# Patient Record
Sex: Male | Born: 1970 | Smoking: Former smoker
Health system: Southern US, Community
[De-identification: ages and names within clinical notes are randomized; demographics above are authoritative.]

## PROBLEM LIST (undated history)

## (undated) DIAGNOSIS — R011 Cardiac murmur, unspecified: Secondary | ICD-10-CM

## (undated) DIAGNOSIS — T4145XA Adverse effect of unspecified anesthetic, initial encounter: Secondary | ICD-10-CM

## (undated) DIAGNOSIS — T8859XA Other complications of anesthesia, initial encounter: Secondary | ICD-10-CM

## (undated) DIAGNOSIS — Z8489 Family history of other specified conditions: Secondary | ICD-10-CM

## (undated) DIAGNOSIS — I499 Cardiac arrhythmia, unspecified: Secondary | ICD-10-CM

## (undated) DIAGNOSIS — M1990 Unspecified osteoarthritis, unspecified site: Secondary | ICD-10-CM

---

## 2017-09-25 ENCOUNTER — Ambulatory Visit: Payer: Self-pay | Admitting: Orthopedic Surgery

## 2017-10-02 ENCOUNTER — Ambulatory Visit: Payer: Self-pay | Admitting: Orthopedic Surgery

## 2017-10-02 NOTE — H&P (View-Only) (Signed)
TOTAL HIP ADMISSION H&P  Patient is admitted for left total hip arthroplasty.  Subjective:  Chief Complaint: left hip pain  HPI: Michael Mcintosh, 47 y.o. male, has a history of pain and functional disability in the left hip(s) due to arthritis and patient has failed non-surgical conservative treatments for greater than 12 weeks to include NSAID's and/or analgesics, flexibility and strengthening excercises, use of assistive devices, weight reduction as appropriate and activity modification.  Onset of symptoms was gradual starting 2 years ago with rapidlly worsening course since that time.The patient noted no past surgery on the left hip(s).  Patient currently rates pain in the left hip at 10 out of 10 with activity. Patient has night pain, worsening of pain with activity and weight bearing, trendelenberg gait, pain that interfers with activities of daily living and pain with passive range of motion. Patient has evidence of subchondral cysts, subchondral sclerosis, periarticular osteophytes and joint space narrowing by imaging studies. This condition presents safety issues increasing the risk of falls.   There is no current active infection.  There are no active problems to display for this patient.  Past Medical History:  Diagnosis Date  . Complication of anesthesia    never had anesthesia  . Dysrhythmia    slow HR arrythmia about 5 years ago, resolved  . Family history of adverse reaction to anesthesia    father stopped breathing with hip surgery  . Heart murmur    "when I was younger"    No past surgical history on file.  Current Outpatient Medications  Medication Sig Dispense Refill Last Dose  . lisinopril (PRINIVIL,ZESTRIL) 20 MG tablet Take 20 mg by mouth daily.      No current facility-administered medications for this visit.    Facility-Administered Medications Ordered in Other Visits  Medication Dose Route Frequency Provider Last Rate Last Dose  . [START ON 10/21/2017]  tranexamic acid (CYKLOKAPRON) 1,000 mg in sodium chloride 0.9 % 100 mL IVPB  1,000 mg Intravenous To OR Kameo Bains, Arlys JohnBrian, MD       No Known Allergies  Social History   Tobacco Use  . Smoking status: Former Smoker    Types: Cigarettes  . Smokeless tobacco: Current User    Types: Chew  . Tobacco comment: quit in 2013  Substance Use Topics  . Alcohol use: Yes    Comment: occasionally- few drinks weekly    No family history on file.   Review of Systems  Constitutional: Negative.   HENT: Negative.   Eyes: Negative.   Respiratory: Negative.   Cardiovascular: Negative.   Gastrointestinal: Negative.   Genitourinary: Negative.   Musculoskeletal: Positive for back pain and joint pain.  Skin: Negative.   Neurological: Negative.   Endo/Heme/Allergies: Negative.   Psychiatric/Behavioral: Negative.     Objective:  Physical Exam  Vitals reviewed. Constitutional: He is oriented to person, place, and time. He appears well-developed and well-nourished.  HENT:  Head: Normocephalic and atraumatic.  Eyes: Pupils are equal, round, and reactive to light. Conjunctivae and EOM are normal.  Neck: Normal range of motion. Neck supple.  Cardiovascular: Normal rate, regular rhythm and intact distal pulses.  Respiratory: Effort normal. No respiratory distress.  GI: Soft. He exhibits no distension.  Genitourinary:  Genitourinary Comments: deferred  Neurological: He is alert and oriented to person, place, and time. He has normal reflexes.  Skin: Skin is warm and dry.  Psychiatric: He has a normal mood and affect. His behavior is normal. Judgment and thought content normal.  Vital signs in last 24 hours: @VSRANGES @  Labs:   Estimated body mass index is 36.46 kg/m as calculated from the following:   Height as of 10/07/17: 5' 9.5" (1.765 m).   Weight as of 10/07/17: 113.6 kg (250 lb 8 oz).   Imaging Review Plain radiographs demonstrate severe degenerative joint disease of the left hip(s).  The bone quality appears to be adequate for age and reported activity level.    Preoperative templating of the joint replacement has been completed, documented, and submitted to the Operating Room personnel in order to optimize intra-operative equipment management.     Assessment/Plan:  End stage arthritis, left hip(s)  The patient history, physical examination, clinical judgement of the provider and imaging studies are consistent with end stage degenerative joint disease of the left hip(s) and total hip arthroplasty is deemed medically necessary. The treatment options including medical management, injection therapy, arthroscopy and arthroplasty were discussed at length. The risks and benefits of total hip arthroplasty were presented and reviewed. The risks due to aseptic loosening, infection, stiffness, dislocation/subluxation,  thromboembolic complications and other imponderables were discussed.  The patient acknowledged the explanation, agreed to proceed with the plan and consent was signed. Patient is being admitted for inpatient treatment for surgery, pain control, PT, OT, prophylactic antibiotics, VTE prophylaxis, progressive ambulation and ADL's and discharge planning.The patient is planning to be discharged home with HEP.

## 2017-10-02 NOTE — H&P (Signed)
TOTAL HIP ADMISSION H&P  Patient is admitted for left total hip arthroplasty.  Subjective:  Chief Complaint: left hip pain  HPI: Michael Mcintosh, 47 y.o. male, has a history of pain and functional disability in the left hip(s) due to arthritis and patient has failed non-surgical conservative treatments for greater than 12 weeks to include NSAID's and/or analgesics, flexibility and strengthening excercises, use of assistive devices, weight reduction as appropriate and activity modification.  Onset of symptoms was gradual starting 2 years ago with rapidlly worsening course since that time.The patient noted no past surgery on the left hip(s).  Patient currently rates pain in the left hip at 10 out of 10 with activity. Patient has night pain, worsening of pain with activity and weight bearing, trendelenberg gait, pain that interfers with activities of daily living and pain with passive range of motion. Patient has evidence of subchondral cysts, subchondral sclerosis, periarticular osteophytes and joint space narrowing by imaging studies. This condition presents safety issues increasing the risk of falls.   There is no current active infection.  There are no active problems to display for this patient.  Past Medical History:  Diagnosis Date  . Complication of anesthesia    never had anesthesia  . Dysrhythmia    slow HR arrythmia about 5 years ago, resolved  . Family history of adverse reaction to anesthesia    father stopped breathing with hip surgery  . Heart murmur    "when I was younger"    No past surgical history on file.  Current Outpatient Medications  Medication Sig Dispense Refill Last Dose  . lisinopril (PRINIVIL,ZESTRIL) 20 MG tablet Take 20 mg by mouth daily.      No current facility-administered medications for this visit.    Facility-Administered Medications Ordered in Other Visits  Medication Dose Route Frequency Provider Last Rate Last Dose  . [START ON 10/21/2017]  tranexamic acid (CYKLOKAPRON) 1,000 mg in sodium chloride 0.9 % 100 mL IVPB  1,000 mg Intravenous To OR Chloie Loney, MD       No Known Allergies  Social History   Tobacco Use  . Smoking status: Former Smoker    Types: Cigarettes  . Smokeless tobacco: Current User    Types: Chew  . Tobacco comment: quit in 2013  Substance Use Topics  . Alcohol use: Yes    Comment: occasionally- few drinks weekly    No family history on file.   Review of Systems  Constitutional: Negative.   HENT: Negative.   Eyes: Negative.   Respiratory: Negative.   Cardiovascular: Negative.   Gastrointestinal: Negative.   Genitourinary: Negative.   Musculoskeletal: Positive for back pain and joint pain.  Skin: Negative.   Neurological: Negative.   Endo/Heme/Allergies: Negative.   Psychiatric/Behavioral: Negative.     Objective:  Physical Exam  Vitals reviewed. Constitutional: He is oriented to person, place, and time. He appears well-developed and well-nourished.  HENT:  Head: Normocephalic and atraumatic.  Eyes: Pupils are equal, round, and reactive to light. Conjunctivae and EOM are normal.  Neck: Normal range of motion. Neck supple.  Cardiovascular: Normal rate, regular rhythm and intact distal pulses.  Respiratory: Effort normal. No respiratory distress.  GI: Soft. He exhibits no distension.  Genitourinary:  Genitourinary Comments: deferred  Neurological: He is alert and oriented to person, place, and time. He has normal reflexes.  Skin: Skin is warm and dry.  Psychiatric: He has a normal mood and affect. His behavior is normal. Judgment and thought content normal.      Vital signs in last 24 hours: @VSRANGES@  Labs:   Estimated body mass index is 36.46 kg/m as calculated from the following:   Height as of 10/07/17: 5' 9.5" (1.765 m).   Weight as of 10/07/17: 113.6 kg (250 lb 8 oz).   Imaging Review Plain radiographs demonstrate severe degenerative joint disease of the left hip(s).  The bone quality appears to be adequate for age and reported activity level.    Preoperative templating of the joint replacement has been completed, documented, and submitted to the Operating Room personnel in order to optimize intra-operative equipment management.     Assessment/Plan:  End stage arthritis, left hip(s)  The patient history, physical examination, clinical judgement of the provider and imaging studies are consistent with end stage degenerative joint disease of the left hip(s) and total hip arthroplasty is deemed medically necessary. The treatment options including medical management, injection therapy, arthroscopy and arthroplasty were discussed at length. The risks and benefits of total hip arthroplasty were presented and reviewed. The risks due to aseptic loosening, infection, stiffness, dislocation/subluxation,  thromboembolic complications and other imponderables were discussed.  The patient acknowledged the explanation, agreed to proceed with the plan and consent was signed. Patient is being admitted for inpatient treatment for surgery, pain control, PT, OT, prophylactic antibiotics, VTE prophylaxis, progressive ambulation and ADL's and discharge planning.The patient is planning to be discharged home with HEP. 

## 2017-10-04 NOTE — Pre-Procedure Instructions (Addendum)
Michael Mcintosh  10/04/2017     No Pharmacies Listed   Your procedure is scheduled on July 22  [facility] at 5:30 A.M.  Call this number if you have problems the morning of surgery:  [phone]   Remember:   Do not eat or drink after midnight.                        Take these medicines the morning of surgery with A SIP OF WATER : none              7 days prior to surgery STOP taking any Aspirin(unless otherwise instructed by your surgeon), Aleve, Naproxen, Ibuprofen, Motrin, Advil, Goody's, BC's, all herbal medications, fish oil, and all vitamins    Do not wear jewelry.  Do not wear lotions, powders, or perfumes, or deodorant.  Do not shave 48 hours prior to surgery.  Men may shave face and neck.  Do not bring valuables to the hospital.  [facility] is not responsible for any belongings or valuables.  Contacts, dentures or bridgework may not be worn into surgery.  Leave your suitcase in the car.  After surgery it may be brought to your room.  For patients admitted to the hospital, discharge time will be determined by your treatment team.  Patients discharged the day of surgery will not be allowed to drive home.    Special instructions:   [facility]- Preparing For Surgery  Before surgery, you can play an important role. Because skin is not sterile, your skin needs to be as free of germs as possible. You can reduce the number of germs on your skin by washing with CHG (chlorahexidine gluconate) Soap before surgery.  CHG is an antiseptic cleaner which kills germs and bonds with the skin to continue killing germs even after washing.    Oral Hygiene is also important to reduce your risk of infection.  Remember - BRUSH YOUR TEETH THE MORNING OF SURGERY WITH YOUR REGULAR TOOTHPASTE  Please do not use if you have an allergy to CHG or antibacterial soaps. If your skin becomes reddened/irritated stop using the CHG.  Do not shave (including legs  and underarms) for at least 48 hours prior to first CHG shower. It is OK to shave your face.  Please follow these instructions carefully.   1. Shower the NIGHT BEFORE SURGERY and the MORNING OF SURGERY with CHG.   2. If you chose to wash your hair, wash your hair first as usual with your normal shampoo.  3. After you shampoo, rinse your hair and body thoroughly to remove the shampoo.  4. Use CHG as you would any other liquid soap. You can apply CHG directly to the skin and wash gently with a scrungie or a clean washcloth.   5. Apply the CHG Soap to your body ONLY FROM THE NECK DOWN.  Do not use on open wounds or open sores. Avoid contact with your eyes, ears, mouth and genitals (private parts). Wash Face and genitals (private parts)  with your normal soap.  6. Wash thoroughly, paying special attention to the area where your surgery will be performed.  7. Thoroughly rinse your body with warm water from the neck down.  8. DO NOT shower/wash with your normal soap after using and rinsing off the CHG Soap.  9. Pat yourself dry with a CLEAN TOWEL.  10. Wear CLEAN PAJAMAS to bed the night before  surgery, wear comfortable clothes the morning of surgery  11. Place CLEAN SHEETS on your bed the night of your first shower and DO NOT SLEEP WITH PETS.    Day of Surgery:  Do not apply any deodorants/lotions.  Please wear clean clothes to the hospital/surgery center.   Remember to brush your teeth WITH YOUR REGULAR TOOTHPASTE.    Please read over the following fact sheets that you were given. Coughing and Deep Breathing, MRSA Information and Surgical Site Infection Prevention

## 2017-10-07 ENCOUNTER — Encounter (HOSPITAL_COMMUNITY): Payer: Self-pay

## 2017-10-07 ENCOUNTER — Other Ambulatory Visit: Payer: Self-pay

## 2017-10-07 ENCOUNTER — Encounter (HOSPITAL_COMMUNITY)
Admission: RE | Admit: 2017-10-07 | Discharge: 2017-10-07 | Disposition: A | Discharge status: Home / Self Care | Payer: BLUE CROSS/BLUE SHIELD | Source: Ambulatory Visit | Attending: Orthopedic Surgery | Admitting: Orthopedic Surgery

## 2017-10-07 DIAGNOSIS — Z01812 Encounter for preprocedural laboratory examination: Secondary | ICD-10-CM | POA: Diagnosis present

## 2017-10-07 HISTORY — DX: Cardiac murmur, unspecified: R01.1

## 2017-10-07 HISTORY — DX: Cardiac arrhythmia, unspecified: I49.9

## 2017-10-07 HISTORY — DX: Adverse effect of unspecified anesthetic, initial encounter: T41.45XA

## 2017-10-07 HISTORY — DX: Family history of other specified conditions: Z84.89

## 2017-10-07 HISTORY — DX: Other complications of anesthesia, initial encounter: T88.59XA

## 2017-10-07 LAB — BASIC METABOLIC PANEL
Anion gap: 6 (ref 5–15)
BUN: 11 mg/dL (ref 6–20)
CO2: 28 mmol/L (ref 22–32)
CREATININE: 0.96 mg/dL (ref 0.61–1.24)
Calcium: 9.5 mg/dL (ref 8.9–10.3)
Chloride: 107 mmol/L (ref 98–111)
GFR calc Af Amer: >60 mL/min (ref >60)
Glucose, Bld: 100 mg/dL — ABNORMAL HIGH (ref 70–99)
Potassium: 3.8 mmol/L (ref 3.5–5.1)
SODIUM: 141 mmol/L (ref 135–145)

## 2017-10-07 LAB — CBC
HEMATOCRIT: 45 % (ref 39.0–52.0)
Hemoglobin: 14.9 g/dL (ref 13.0–17.0)
MCH: 29.9 pg (ref 26.0–34.0)
MCHC: 33.1 g/dL (ref 30.0–36.0)
MCV: 90.2 fL (ref 78.0–100.0)
Platelets: 242 10*3/uL (ref 150–400)
RBC: 4.99 MIL/uL (ref 4.22–5.81)
RDW: 12.1 % (ref 11.5–15.5)
WBC: 6.7 10*3/uL (ref 4.0–10.5)

## 2017-10-07 LAB — TYPE AND SCREEN
ABO/RH(D): A POS
ANTIBODY SCREEN: NEGATIVE

## 2017-10-07 LAB — SURGICAL PCR SCREEN
MRSA, PCR: NEGATIVE
Staphylococcus aureus: NEGATIVE

## 2017-10-07 LAB — ABO/RH: ABO/RH(D): A POS

## 2017-10-07 NOTE — Progress Notes (Signed)
Anesthesia Chart Review:  Case:  [id number] Date/Time:  10/21/17 0715   Procedure:  LEFT TOTAL HIP ARTHROPLASTY ANTERIOR APPROACH (Left Hip) - needs RNFA   Anesthesia type:  Spinal   Pre-op diagnosis:  Degenerative joint disease left hip   Location:  [facility] OR ROOM 07 / [facility] OR   Surgeon:  Samson FredericSwinteck, Brian, MD      DISCUSSION: 47 yo male former smoker scheduled for above procedure. Pertinent history includes dysrhythmia/bradycardia that pt states he had when he was younger but ha since resolved.   He [facility] I have requested these records multiple times and spoke [facility] but have thus far not received them.  I called and spoke with the patient to clarify why he had the stress test.  He states he had it for evaluation of PVCs. He reports the test was normal and the PVCs ultimately resolved with decreased caffeine intake and he has had no further cardiac evaluation. He denies and SOB, CP, or other cardiopulmonary symptoms.   He was seen 08/30/2017 by Marveen Reeksonald Murphy, PA-C at the [facility] [place] doe surgical clearance, see copy in pt chart which states he has a revised cardiac risk index of 0.4%. EKG was done at this visit which showed NSR.  Anticipate pt can proceed with surgery as planned barring acute status change.  VS: BP (!) 152/77   Pulse 60   Temp 36.8 C (Oral)   Resp 18   Ht 5' 9.5" (1.765 m)   Wt 250 lb 8 oz (113.6 kg)   SpO2 96%   BMI 36.46 kg/m   PROVIDERS: [facility]   LABS: Labs reviewed: Acceptable for surgery. (all labs ordered are listed, but only abnormal results are displayed)  Labs Reviewed  BASIC METABOLIC PANEL - Abnormal; Notable for the following components:      Result Value   Glucose, Bld 100 (*)    All other components within normal limits  SURGICAL PCR SCREEN  CBC  TYPE AND SCREEN  ABO/RH     IMAGES: N/A   EKG: 08/30/2017 (outside record, see  copy in pt chart): Normal sinus rhythm   CV: N/A  Past Medical History:  Diagnosis Date  . Complication of anesthesia    never had anesthesia  . Dysrhythmia    slow HR arrythmia about 5 years ago, resolved  . Family history of adverse reaction to anesthesia    father stopped breathing with hip surgery  . Heart murmur    "when I was younger"    History reviewed. No pertinent surgical history.  MEDICATIONS: . lisinopril (PRINIVIL,ZESTRIL) 20 MG tablet   No current facility-administered medications for this encounter.      Zannie CoveJames Faren Florence, PA-C [facility]Anesthesiology Phone [phone] 10/18/2017 3:08 PM

## 2017-10-07 NOTE — Progress Notes (Addendum)
[facility] [place] [facility] - denies  [facility], pt reports [facility] in the last [place] [facility]  Anesthesia review: [facility], pt reports hx of arrythmia and low HR in the past, which has resolved.   Patient denies shortness of breath, fever, cough and chest pain at PAT appointment   Patient verbalized understanding of instructions that were given to them at the PAT appointment. Patient was also instructed that they will need to review over the PAT instructions again at home before surgery.

## 2017-10-18 MED ORDER — TRANEXAMIC ACID 1000 MG/10ML IV SOLN
1000.0000 mg | INTRAVENOUS | Status: AC
  Administered 2017-10-21: 1000 mg via INTRAVENOUS
  Filled 2017-10-18: qty 1100

## 2017-10-20 NOTE — Anesthesia Preprocedure Evaluation (Signed)
Anesthesia Evaluation  Patient identified by MRN, date of birth, ID band Patient awake    Reviewed: Allergy & Precautions, H&P , NPO status , Patient's Chart, lab work & pertinent test results, reviewed documented beta blocker date and time   Airway Mallampati: II  TM Distance: >3 FB Neck ROM: full    Dental no notable dental hx.    Pulmonary neg pulmonary ROS, former smoker,    Pulmonary exam normal breath sounds clear to auscultation       Cardiovascular Exercise Tolerance: Good negative cardio ROS   Rhythm:regular Rate:Normal     Neuro/Psych negative neurological ROS  negative psych ROS   GI/Hepatic negative GI ROS, Neg liver ROS,   Endo/Other  negative endocrine ROS  Renal/GU negative Renal ROS  negative genitourinary   Musculoskeletal   Abdominal   Peds  Hematology negative hematology ROS (+)   Anesthesia Other Findings   Reproductive/Obstetrics negative OB ROS                             Anesthesia Physical Anesthesia Plan  ASA: II  Anesthesia Plan: Spinal   Post-op Pain Management:    Induction:   PONV Risk Score and Plan: 1 and Treatment may vary due to age or medical condition  Airway Management Planned: Nasal Cannula and Natural Airway  Additional Equipment:   Intra-op Plan:   Post-operative Plan:   Informed Consent: I have reviewed the patients History and Physical, chart, labs and discussed the procedure including the risks, benefits and alternatives for the proposed anesthesia with the patient or authorized representative who has indicated his/her understanding and acceptance.   Dental Advisory Given  Plan Discussed with: CRNA, Anesthesiologist and Surgeon  Anesthesia Plan Comments: (  )        Anesthesia Quick Evaluation

## 2017-10-21 ENCOUNTER — Other Ambulatory Visit: Payer: Self-pay

## 2017-10-21 ENCOUNTER — Inpatient Hospital Stay (HOSPITAL_COMMUNITY): Payer: BLUE CROSS/BLUE SHIELD

## 2017-10-21 ENCOUNTER — Encounter (HOSPITAL_COMMUNITY)
Admission: RE | Disposition: A | Discharge status: Home / Self Care | Payer: Self-pay | Source: Home / Self Care | Attending: Orthopedic Surgery

## 2017-10-21 ENCOUNTER — Encounter (HOSPITAL_COMMUNITY): Payer: Self-pay | Admitting: *Deleted

## 2017-10-21 ENCOUNTER — Inpatient Hospital Stay (HOSPITAL_COMMUNITY)
Admission: RE | Admit: 2017-10-21 | Discharge: 2017-10-22 | DRG: 470 | Disposition: A | Discharge status: Home / Self Care | Payer: BLUE CROSS/BLUE SHIELD | Attending: Orthopedic Surgery | Admitting: Orthopedic Surgery

## 2017-10-21 ENCOUNTER — Inpatient Hospital Stay (HOSPITAL_COMMUNITY): Payer: BLUE CROSS/BLUE SHIELD | Admitting: Physician Assistant

## 2017-10-21 ENCOUNTER — Inpatient Hospital Stay (HOSPITAL_COMMUNITY): Payer: BLUE CROSS/BLUE SHIELD | Admitting: Anesthesiology

## 2017-10-21 DIAGNOSIS — M1612 Unilateral primary osteoarthritis, left hip: Secondary | ICD-10-CM | POA: Diagnosis present

## 2017-10-21 DIAGNOSIS — Z419 Encounter for procedure for purposes other than remedying health state, unspecified: Secondary | ICD-10-CM

## 2017-10-21 DIAGNOSIS — Z79899 Other long term (current) drug therapy: Secondary | ICD-10-CM | POA: Diagnosis not present

## 2017-10-21 DIAGNOSIS — F17290 Nicotine dependence, other tobacco product, uncomplicated: Secondary | ICD-10-CM | POA: Diagnosis present

## 2017-10-21 DIAGNOSIS — Z09 Encounter for follow-up examination after completed treatment for conditions other than malignant neoplasm: Secondary | ICD-10-CM

## 2017-10-21 HISTORY — PX: TOTAL HIP ARTHROPLASTY: SHX124

## 2017-10-21 HISTORY — DX: Unspecified osteoarthritis, unspecified site: M19.90

## 2017-10-21 SURGERY — ARTHROPLASTY, HIP, TOTAL, ANTERIOR APPROACH
Anesthesia: Spinal | Site: Hip | Laterality: Left

## 2017-10-21 MED ORDER — SODIUM CHLORIDE 0.9 % IJ SOLN
INTRAMUSCULAR | Status: DC | PRN
  Administered 2017-10-21: 29 mL via INTRAVENOUS

## 2017-10-21 MED ORDER — FENTANYL CITRATE (PF) 250 MCG/5ML IJ SOLN
INTRAMUSCULAR | Status: AC
  Filled 2017-10-21: qty 5

## 2017-10-21 MED ORDER — SODIUM CHLORIDE 0.9 % IR SOLN
Status: DC | PRN
  Administered 2017-10-21: 3000 mL

## 2017-10-21 MED ORDER — ALUM & MAG HYDROXIDE-SIMETH 200-200-20 MG/5ML PO SUSP
30.0000 mL | ORAL | Status: DC | PRN
Start: 2017-10-21 — End: 2017-10-22

## 2017-10-21 MED ORDER — EPHEDRINE SULFATE 50 MG/ML IJ SOLN
INTRAMUSCULAR | Status: DC | PRN
  Administered 2017-10-21: 10 mg via INTRAVENOUS

## 2017-10-21 MED ORDER — ACETAMINOPHEN 325 MG PO TABS: 325.0000 mg | ORAL_TABLET | ORAL | Status: DC | PRN

## 2017-10-21 MED ORDER — METHOCARBAMOL 1000 MG/10ML IJ SOLN
500.0000 mg | Freq: Four times a day (QID) | INTRAVENOUS | Status: DC | PRN
  Filled 2017-10-21: qty 5

## 2017-10-21 MED ORDER — PROPOFOL 10 MG/ML IV BOLUS
INTRAVENOUS | Status: AC
  Filled 2017-10-21: qty 20

## 2017-10-21 MED ORDER — HYDROCODONE-ACETAMINOPHEN 7.5-325 MG PO TABS: 1.0000 | ORAL_TABLET | ORAL | Status: DC | PRN

## 2017-10-21 MED ORDER — CHLORHEXIDINE GLUCONATE 4 % EX LIQD: 60.0000 mL | Freq: Once | CUTANEOUS | Status: DC

## 2017-10-21 MED ORDER — POVIDONE-IODINE 10 % EX SWAB: 2.0000 "application " | Freq: Once | CUTANEOUS | Status: DC

## 2017-10-21 MED ORDER — SODIUM CHLORIDE 0.9 % IV SOLN
INTRAVENOUS | Status: DC
  Administered 2017-10-21: 13:00:00 via INTRAVENOUS

## 2017-10-21 MED ORDER — HYDROCODONE-ACETAMINOPHEN 5-325 MG PO TABS: 1.0000 | ORAL_TABLET | ORAL | Status: DC | PRN

## 2017-10-21 MED ORDER — ONDANSETRON HCL 4 MG/2ML IJ SOLN: 4.0000 mg | Freq: Four times a day (QID) | INTRAMUSCULAR | Status: DC | PRN

## 2017-10-21 MED ORDER — POLYETHYLENE GLYCOL 3350 17 G PO PACK: 17.0000 g | PACK | Freq: Every day | ORAL | Status: DC | PRN

## 2017-10-21 MED ORDER — KETOROLAC TROMETHAMINE 30 MG/ML IJ SOLN
INTRAMUSCULAR | Status: DC | PRN
  Administered 2017-10-21: 30 mg via INTRA_ARTICULAR

## 2017-10-21 MED ORDER — BUPIVACAINE IN DEXTROSE 0.75-8.25 % IT SOLN
INTRATHECAL | Status: DC | PRN
  Administered 2017-10-21: 2 mL via INTRATHECAL

## 2017-10-21 MED ORDER — CEFAZOLIN SODIUM-DEXTROSE 2-4 GM/100ML-% IV SOLN
2.0000 g | INTRAVENOUS | Status: AC
  Administered 2017-10-21: 2 g via INTRAVENOUS
  Filled 2017-10-21: qty 100

## 2017-10-21 MED ORDER — KETOROLAC TROMETHAMINE 15 MG/ML IJ SOLN
15.0000 mg | Freq: Four times a day (QID) | INTRAMUSCULAR | Status: DC
  Administered 2017-10-21 – 2017-10-22 (×3): 15 mg via INTRAVENOUS
  Filled 2017-10-21 (×3): qty 1

## 2017-10-21 MED ORDER — MIDAZOLAM HCL 2 MG/2ML IJ SOLN
INTRAMUSCULAR | Status: AC
  Filled 2017-10-21: qty 2

## 2017-10-21 MED ORDER — SENNA 8.6 MG PO TABS
1.0000 | ORAL_TABLET | Freq: Two times a day (BID) | ORAL | Status: DC
  Administered 2017-10-21 – 2017-10-22 (×2): 8.6 mg via ORAL
  Filled 2017-10-21 (×2): qty 1

## 2017-10-21 MED ORDER — PHENOL 1.4 % MT LIQD: 1.0000 | OROMUCOSAL | Status: DC | PRN

## 2017-10-21 MED ORDER — BUPIVACAINE-EPINEPHRINE (PF) 0.5% -1:200000 IJ SOLN
INTRAMUSCULAR | Status: DC | PRN
  Administered 2017-10-21: 30 mL

## 2017-10-21 MED ORDER — SODIUM CHLORIDE 0.9 % IV SOLN: INTRAVENOUS | Status: DC

## 2017-10-21 MED ORDER — PROPOFOL 500 MG/50ML IV EMUL
INTRAVENOUS | Status: DC | PRN
  Administered 2017-10-21: 50 ug/kg/min via INTRAVENOUS

## 2017-10-21 MED ORDER — FENTANYL CITRATE (PF) 100 MCG/2ML IJ SOLN
INTRAMUSCULAR | Status: DC | PRN
  Administered 2017-10-21 (×2): 50 ug via INTRAVENOUS

## 2017-10-21 MED ORDER — METOCLOPRAMIDE HCL 5 MG PO TABS: 5.0000 mg | ORAL_TABLET | Freq: Three times a day (TID) | ORAL | Status: DC | PRN

## 2017-10-21 MED ORDER — ACETAMINOPHEN 160 MG/5ML PO SOLN: 325.0000 mg | ORAL | Status: DC | PRN

## 2017-10-21 MED ORDER — ACETAMINOPHEN 10 MG/ML IV SOLN
1000.0000 mg | INTRAVENOUS | Status: AC
  Administered 2017-10-21: 1000 mg via INTRAVENOUS
  Filled 2017-10-21: qty 100

## 2017-10-21 MED ORDER — KETOROLAC TROMETHAMINE 30 MG/ML IJ SOLN
INTRAMUSCULAR | Status: AC
  Filled 2017-10-21: qty 1

## 2017-10-21 MED ORDER — METHOCARBAMOL 500 MG PO TABS
500.0000 mg | ORAL_TABLET | Freq: Four times a day (QID) | ORAL | Status: DC | PRN
  Administered 2017-10-22: 500 mg via ORAL
  Filled 2017-10-21: qty 1

## 2017-10-21 MED ORDER — MORPHINE SULFATE (PF) 2 MG/ML IV SOLN: 0.5000 mg | INTRAVENOUS | Status: DC | PRN

## 2017-10-21 MED ORDER — ASPIRIN 81 MG PO CHEW
81.0000 mg | CHEWABLE_TABLET | Freq: Two times a day (BID) | ORAL | Status: DC
  Administered 2017-10-21 – 2017-10-22 (×2): 81 mg via ORAL
  Filled 2017-10-21 (×2): qty 1

## 2017-10-21 MED ORDER — ONDANSETRON HCL 4 MG/2ML IJ SOLN: 4.0000 mg | Freq: Once | INTRAMUSCULAR | Status: DC | PRN

## 2017-10-21 MED ORDER — OXYCODONE HCL 5 MG PO TABS: 5.0000 mg | ORAL_TABLET | Freq: Once | ORAL | Status: DC | PRN

## 2017-10-21 MED ORDER — METOCLOPRAMIDE HCL 5 MG/ML IJ SOLN: 5.0000 mg | Freq: Three times a day (TID) | INTRAMUSCULAR | Status: DC | PRN

## 2017-10-21 MED ORDER — OXYCODONE HCL 5 MG/5ML PO SOLN: 5.0000 mg | Freq: Once | ORAL | Status: DC | PRN

## 2017-10-21 MED ORDER — MIDAZOLAM HCL 5 MG/5ML IJ SOLN
INTRAMUSCULAR | Status: DC | PRN
  Administered 2017-10-21: 2 mg via INTRAVENOUS

## 2017-10-21 MED ORDER — CEFAZOLIN SODIUM-DEXTROSE 2-4 GM/100ML-% IV SOLN
2.0000 g | Freq: Four times a day (QID) | INTRAVENOUS | Status: AC
  Administered 2017-10-21 (×2): 2 g via INTRAVENOUS
  Filled 2017-10-21 (×2): qty 100

## 2017-10-21 MED ORDER — BUPIVACAINE-EPINEPHRINE (PF) 0.5% -1:200000 IJ SOLN
INTRAMUSCULAR | Status: AC
  Filled 2017-10-21: qty 30

## 2017-10-21 MED ORDER — MEPERIDINE HCL 50 MG/ML IJ SOLN: 6.2500 mg | INTRAMUSCULAR | Status: DC | PRN

## 2017-10-21 MED ORDER — FENTANYL CITRATE (PF) 100 MCG/2ML IJ SOLN: 25.0000 ug | INTRAMUSCULAR | Status: DC | PRN

## 2017-10-21 MED ORDER — LISINOPRIL 20 MG PO TABS
20.0000 mg | ORAL_TABLET | Freq: Every day | ORAL | Status: DC
  Administered 2017-10-22: 20 mg via ORAL
  Filled 2017-10-21: qty 1

## 2017-10-21 MED ORDER — ACETAMINOPHEN 325 MG PO TABS: 325.0000 mg | ORAL_TABLET | Freq: Four times a day (QID) | ORAL | Status: DC | PRN

## 2017-10-21 MED ORDER — DIPHENHYDRAMINE HCL 12.5 MG/5ML PO ELIX: 12.5000 mg | ORAL_SOLUTION | ORAL | Status: DC | PRN

## 2017-10-21 MED ORDER — MENTHOL 3 MG MT LOZG: 1.0000 | LOZENGE | OROMUCOSAL | Status: DC | PRN

## 2017-10-21 MED ORDER — DOCUSATE SODIUM 100 MG PO CAPS
100.0000 mg | ORAL_CAPSULE | Freq: Two times a day (BID) | ORAL | Status: DC
  Administered 2017-10-21 – 2017-10-22 (×2): 100 mg via ORAL
  Filled 2017-10-21 (×2): qty 1

## 2017-10-21 MED ORDER — LACTATED RINGERS IV SOLN
INTRAVENOUS | Status: DC | PRN
  Administered 2017-10-21 (×2): via INTRAVENOUS

## 2017-10-21 MED ORDER — ONDANSETRON HCL 4 MG PO TABS: 4.0000 mg | ORAL_TABLET | Freq: Four times a day (QID) | ORAL | Status: DC | PRN

## 2017-10-21 MED ORDER — 0.9 % SODIUM CHLORIDE (POUR BTL) OPTIME
TOPICAL | Status: DC | PRN
  Administered 2017-10-21: 1000 mL

## 2017-10-21 MED ORDER — DEXAMETHASONE SODIUM PHOSPHATE 10 MG/ML IJ SOLN
10.0000 mg | Freq: Once | INTRAMUSCULAR | Status: AC
  Administered 2017-10-22: 10 mg via INTRAVENOUS
  Filled 2017-10-21: qty 1

## 2017-10-21 SURGICAL SUPPLY — 55 items
ALCOHOL ISOPROPYL (RUBBING) (MISCELLANEOUS) ×3 IMPLANT
BLADE CLIPPER SURG (BLADE) IMPLANT
CAPT HIP TOTAL 2 ×3 IMPLANT
CHLORAPREP W/TINT 26ML (MISCELLANEOUS) ×3 IMPLANT
COVER SURGICAL LIGHT HANDLE (MISCELLANEOUS) ×3 IMPLANT
DERMABOND ADVANCED (GAUZE/BANDAGES/DRESSINGS) ×4
DERMABOND ADVANCED .7 DNX12 (GAUZE/BANDAGES/DRESSINGS) ×2 IMPLANT
DRAPE C-ARM 42X72 X-RAY (DRAPES) ×3 IMPLANT
DRAPE INCISE IOBAN 66X45 STRL (DRAPES) ×3 IMPLANT
DRAPE STERI IOBAN 125X83 (DRAPES) ×3 IMPLANT
DRAPE U-SHAPE 47X51 STRL (DRAPES) ×6 IMPLANT
DRSG AQUACEL AG ADV 3.5X10 (GAUZE/BANDAGES/DRESSINGS) ×3 IMPLANT
ELECT BLADE 4.0 EZ CLEAN MEGAD (MISCELLANEOUS) ×3
ELECT PENCIL ROCKER SW 15FT (MISCELLANEOUS) ×3 IMPLANT
ELECT REM PT RETURN 9FT ADLT (ELECTROSURGICAL) ×3
ELECTRODE BLDE 4.0 EZ CLN MEGD (MISCELLANEOUS) ×1 IMPLANT
ELECTRODE REM PT RTRN 9FT ADLT (ELECTROSURGICAL) ×1 IMPLANT
EVACUATOR 1/8 PVC DRAIN (DRAIN) IMPLANT
GLOVE BIO SURGEON STRL SZ8.5 (GLOVE) ×6 IMPLANT
GLOVE BIOGEL PI IND STRL 8.5 (GLOVE) ×1 IMPLANT
GLOVE BIOGEL PI INDICATOR 8.5 (GLOVE) ×2
GOWN STRL REUS W/ TWL LRG LVL3 (GOWN DISPOSABLE) ×1 IMPLANT
GOWN STRL REUS W/TWL 2XL LVL3 (GOWN DISPOSABLE) ×6 IMPLANT
GOWN STRL REUS W/TWL LRG LVL3 (GOWN DISPOSABLE) ×2
HANDPIECE INTERPULSE COAX TIP (DISPOSABLE) ×2
HOOD PEEL AWAY FACE SHEILD DIS (HOOD) IMPLANT
HOOD PEEL AWAY FLYTE STAYCOOL (MISCELLANEOUS) ×6 IMPLANT
KIT BASIN OR (CUSTOM PROCEDURE TRAY) ×3 IMPLANT
KIT TURNOVER KIT B (KITS) ×3 IMPLANT
MANIFOLD NEPTUNE II (INSTRUMENTS) ×3 IMPLANT
MARKER SKIN DUAL TIP RULER LAB (MISCELLANEOUS) ×6 IMPLANT
NEEDLE SPNL 18GX3.5 QUINCKE PK (NEEDLE) ×3 IMPLANT
NS IRRIG 1000ML POUR BTL (IV SOLUTION) ×3 IMPLANT
PACK TOTAL JOINT (CUSTOM PROCEDURE TRAY) ×3 IMPLANT
PACK UNIVERSAL I (CUSTOM PROCEDURE TRAY) ×3 IMPLANT
PAD ARMBOARD 7.5X6 YLW CONV (MISCELLANEOUS) ×3 IMPLANT
SAW OSC TIP CART 19.5X105X1.3 (SAW) ×3 IMPLANT
SEALER BIPOLAR AQUA 6.0 (INSTRUMENTS) ×3 IMPLANT
SET HNDPC FAN SPRY TIP SCT (DISPOSABLE) ×1 IMPLANT
SOL PREP POV-IOD 4OZ 10% (MISCELLANEOUS) IMPLANT
SUT ETHIBOND NAB CT1 #1 30IN (SUTURE) ×6 IMPLANT
SUT MNCRL AB 3-0 PS2 18 (SUTURE) ×3 IMPLANT
SUT MON AB 2-0 CT1 36 (SUTURE) ×3 IMPLANT
SUT VIC AB 1 CT1 27 (SUTURE) ×2
SUT VIC AB 1 CT1 27XBRD ANBCTR (SUTURE) ×1 IMPLANT
SUT VIC AB 2-0 CT1 27 (SUTURE) ×2
SUT VIC AB 2-0 CT1 TAPERPNT 27 (SUTURE) ×1 IMPLANT
SUT VLOC 180 0 24IN GS25 (SUTURE) ×3 IMPLANT
SYR 50ML LL SCALE MARK (SYRINGE) ×3 IMPLANT
TOWEL OR 17X24 6PK STRL BLUE (TOWEL DISPOSABLE) ×3 IMPLANT
TOWEL OR 17X26 10 PK STRL BLUE (TOWEL DISPOSABLE) ×3 IMPLANT
TRAY CATH 16FR W/PLASTIC CATH (SET/KITS/TRAYS/PACK) ×3 IMPLANT
TRAY FOLEY CATH SILVER 16FR (SET/KITS/TRAYS/PACK) IMPLANT
WATER STERILE IRR 1000ML POUR (IV SOLUTION) IMPLANT
YANKAUER SUCT BULB TIP NO VENT (SUCTIONS) ×3 IMPLANT

## 2017-10-21 NOTE — Addendum Note (Signed)
Addendum  created 10/21/17 1402 by Bethena Midgetddono, Kinesha Auten, MD   Child order released for a procedure order, Intraprocedure Blocks edited, Intraprocedure Meds edited, Sign clinical note

## 2017-10-21 NOTE — Progress Notes (Signed)
1140 received pt from PACU, A&O x4. Left hip incision with Aquacell dressing dry and intact. Ice pack in place. Mild numbness to LE. Denies pain at this time.

## 2017-10-21 NOTE — Anesthesia Procedure Notes (Signed)
Spinal  Patient location during procedure: OR Start time: 10/21/2017 7:40 AM End time: 10/21/2017 7:55 AM Staffing Anesthesiologist: Bethena Midgetddono, Aspyn Warnke, MD Preanesthetic Checklist Completed: patient identified, site marked, surgical consent, pre-op evaluation, timeout performed, IV checked, risks and benefits discussed and monitors and equipment checked Spinal Block Patient position: sitting Prep: DuraPrep Patient monitoring: heart rate, cardiac monitor, continuous pulse ox and blood pressure Approach: midline Location: L3-4 Injection technique: single-shot Needle Needle type: Sprotte  Needle gauge: 24 G Needle length: 9 cm Assessment Sensory level: T4

## 2017-10-21 NOTE — Op Note (Signed)
OPERATIVE REPORT  SURGEON: Samson FredericBrian Glover Capano, MD   ASSISTANT: Hart CarwinJustin Queen, RNFA.  PREOPERATIVE DIAGNOSIS: Left hip arthritis.   POSTOPERATIVE DIAGNOSIS: Left hip arthritis.   PROCEDURE: Left total hip arthroplasty, anterior approach.   IMPLANTS: DePuy Tri Lock stem, size 6, hi offset. DePuy Pinnacle Cup, size 58 mm. DePuy Altrx liner, size 36 by 58 mm, neutral. DePuy Biolox ceramic head ball, size 36 + 1.5 mm.  ANESTHESIA:  Spinal  ESTIMATED BLOOD LOSS:-300 mL    ANTIBIOTICS: 2 g Ancef.  DRAINS: None.  COMPLICATIONS: None.   CONDITION: PACU - hemodynamically stable.   BRIEF CLINICAL NOTE: Michael HartsSteven Mcintosh is a 47 y.o. male with a long-standing history of Left hip arthritis. After failing conservative management, the patient was indicated for total hip arthroplasty. The risks, benefits, and alternatives to the procedure were explained, and the patient elected to proceed.  PROCEDURE IN DETAIL: Surgical site was marked by myself in the pre-op holding area. Once inside the operating room, spinal anesthesia was obtained, and a foley catheter was inserted. The patient was then positioned on the Hana table. All bony prominences were well padded. The hip was prepped and draped in the normal sterile surgical fashion. A time-out was called verifying side and site of surgery. The patient received IV antibiotics within 60 minutes of beginning the procedure.  The direct anterior approach to the hip was performed through the Hueter interval. Lateral femoral circumflex vessels were treated with the Auqumantys. The anterior capsule was exposed and an inverted T capsulotomy was made.The femoral neck cut was made to the level of the templated cut. A corkscrew was placed into the head and the head was removed. The femoral head was found to have eburnated bone. The head was passed to the back table and was measured.  Acetabular exposure was achieved, and the pulvinar and labrum were  excised. Sequential reaming of the acetabulum was then performed up to a size 57 mm reamer. A 58 mm cup was then opened and impacted into place at approximately 40 degrees of abduction and 20 degrees of anteversion. The final polyethylene liner was impacted into place and acetabular osteophytes were removed.   I then gained femoral exposure taking care to protect the abductors and greater trochanter. This was performed using standard external rotation, extension, and adduction. The capsule was peeled off the inner aspect of the greater trochanter, taking care to preserve the short external rotators. A cookie cutter was used to enter the femoral canal, and then the femoral canal finder was placed. Sequential broaching was performed up to a size 6. Calcar planer was used on the femoral neck remnant. I placed a hi offset neck and a trial head ball. The hip was reduced. Leg lengths and offset were checked fluoroscopically. The hip was dislocated and trial components were removed. The final implants were placed, and the hip was reduced.  Fluoroscopy was used to confirm component position and leg lengths. At 90 degrees of external rotation and full extension, the hip was stable to an anterior directed force.  The wound was copiously irrigated with normal saline using pulse lavage. Marcaine solution was injected into the periarticular soft tissue. The wound was closed in layers using #1 Vicryl and V-Loc for the fascia, 2-0 Vicryl for the subcutaneous fat, 2-0 Monocryl for the deep dermal layer, 3-0 running Monocryl subcuticular stitch, and Dermabond for the skin. Once the glue was fully dried, an Aquacell Ag dressing was applied. The patient was transported to the recovery room in  stable condition. Sponge, needle, and instrument counts were correct at the end of the case x2. The patient tolerated the procedure well and there were no known complications.

## 2017-10-21 NOTE — Anesthesia Procedure Notes (Signed)
Procedure Name: MAC Date/Time: 10/21/2017 7:45 AM Performed by: Lance Coon, CRNA Pre-anesthesia Checklist: Patient identified, Emergency Drugs available, Suction available, Patient being monitored and Timeout performed Patient Re-evaluated:Patient Re-evaluated prior to induction Oxygen Delivery Method: Simple face mask

## 2017-10-21 NOTE — Anesthesia Postprocedure Evaluation (Signed)
Anesthesia Post Note  Patient: Michael Mcintosh  Procedure(s) Performed: LEFT TOTAL HIP ARTHROPLASTY ANTERIOR APPROACH (Left Hip)     Patient location during evaluation: PACU Anesthesia Type: Spinal Level of consciousness: oriented and awake and alert Pain management: pain level controlled Vital Signs Assessment: post-procedure vital signs reviewed and stable Respiratory status: spontaneous breathing, respiratory function stable and patient connected to nasal cannula oxygen Cardiovascular status: blood pressure returned to baseline and stable Postop Assessment: no headache, no backache and no apparent nausea or vomiting Anesthetic complications: no    Last Vitals:  Vitals:   10/21/17 1023 10/21/17 1038  BP: 95/68 102/68  Pulse: (!) 57 (!) 53  Resp: 15 17  Temp:    SpO2: 96% 98%    Last Pain:  Vitals:   10/21/17 1038  PainSc: 0-No pain    LLE Motor Response: No movement due to regional block(spinal) (10/21/17 1038) LLE Sensation: Decreased;Numbness (10/21/17 1038) RLE Motor Response: No movement due to regional block(spinal) (10/21/17 1038) RLE Sensation: Decreased;Numbness (10/21/17 1038) L Sensory Level: L4-Anterior knee, lower leg (10/21/17 1038) R Sensory Level: L4-Anterior knee, lower leg (10/21/17 1038)  Eiza Canniff

## 2017-10-21 NOTE — Transfer of Care (Signed)
Immediate Anesthesia Transfer of Care Note  Patient: Michael Mcintosh  Procedure(s) Performed: LEFT TOTAL HIP ARTHROPLASTY ANTERIOR APPROACH (Left Hip)  Patient Location: PACU  Anesthesia Type:Spinal and MAC combined with regional for post-op pain  Level of Consciousness: awake and patient cooperative  Airway & Oxygen Therapy: Patient Spontanous Breathing  Post-op Assessment: Report given to RN and Post -op Vital signs reviewed and stable  Post vital signs: Reviewed and stable  Last Vitals:  Vitals Value Taken Time  BP 92/64 10/21/2017 10:09 AM  Temp    Pulse 68 10/21/2017 10:10 AM  Resp 12 10/21/2017 10:10 AM  SpO2 97 % 10/21/2017 10:10 AM  Vitals shown include unvalidated device data.  Last Pain:  Vitals:   10/21/17 0659  PainSc: 7       Patients Stated Pain Goal: 3 (36/06/77 0340)  Complications: No apparent anesthesia complications

## 2017-10-21 NOTE — Interval H&P Note (Signed)
History and Physical Interval Note:  10/21/2017 7:26 AM  Michael Mcintosh  has presented today for surgery, with the diagnosis of Degenerative joint disease left hip  The various methods of treatment have been discussed with the patient and family. After consideration of risks, benefits and other options for treatment, the patient has consented to  Procedure(s) with comments: LEFT TOTAL HIP ARTHROPLASTY ANTERIOR APPROACH (Left) - needs RNFA as a surgical intervention .  The patient's history has been reviewed, patient examined, no change in status, stable for surgery.  I have reviewed the patient's chart and labs.  Questions were answered to the patient's satisfaction.     Iline OvenBrian J Raniah Karan

## 2017-10-21 NOTE — Discharge Instructions (Signed)
°Dr. Drema Eddington °Joint Replacement Specialist °[facility] °[address] °[place], [place] [address] °[phone] ° ° °TOTAL HIP REPLACEMENT POSTOPERATIVE DIRECTIONS ° ° ° °Hip Rehabilitation, Guidelines Following Surgery  ° °WEIGHT BEARING °Weight bearing as tolerated with assist device (walker, cane, etc) as directed, use it as long as suggested by your surgeon or therapist, typically at least 4-6 weeks. ° °The results of a hip operation are greatly improved after range of motion and muscle strengthening exercises. Follow all safety measures which are given to protect your hip. If any of these exercises cause increased pain or swelling in your joint, decrease the amount until you are comfortable again. Then slowly increase the exercises. Call your caregiver if you have problems or questions.  ° °HOME CARE INSTRUCTIONS  °Most of the following instructions are designed to prevent the dislocation of your new hip.  °Remove items at home which could result in a fall. This includes throw rugs or furniture in walking pathways.  °Continue medications as instructed at time of discharge. °· You may have some home medications which will be placed on hold until you complete the course of blood thinner medication. °· You may start showering once you are discharged home. Do not remove your dressing. °Do not put on socks or shoes without following the instructions of your caregivers.   °Sit on chairs with arms. Use the chair arms to help push yourself up when arising.  °Arrange for the use of a toilet seat elevator so you are not sitting low.  °· Walk with walker as instructed.  °You may resume a sexual relationship in one month or when given the OK by your caregiver.  °Use walker as long as suggested by your caregivers.  °You may put full weight on your legs and walk as much as is comfortable. °Avoid periods of inactivity such as sitting longer than an hour when not asleep. This helps prevent  blood clots.  °You may return to work once you are cleared by your surgeon.  °Do not drive a car for 6 weeks or until released by your surgeon.  °Do not drive while taking narcotics.  °Wear elastic stockings for two weeks following surgery during the day but you may remove then at night.  °Make sure you keep all of your appointments after your operation with all of your doctors and caregivers. You should call the office at the above phone number and make an appointment for approximately two weeks after the date of your surgery. °Please pick up a stool softener and laxative for home use as long as you are requiring pain medications. °· ICE to the affected hip every three hours for 30 minutes at a time and then as needed for pain and swelling. Continue to use ice on the hip for pain and swelling from surgery. You may notice swelling that will progress down to the foot and ankle.  This is normal after surgery.  Elevate the leg when you are not up walking on it.   °It is important for you to complete the blood thinner medication as prescribed by your doctor. °· Continue to use the breathing machine which will help keep your temperature down.  It is common for your temperature to cycle up and down following surgery, especially at night when you are not up moving around and exerting yourself.  The breathing machine keeps your lungs expanded and your temperature down. ° °RANGE OF MOTION AND STRENGTHENING EXERCISES  °These exercises are   designed to help you keep full movement of your hip joint. Follow your caregiver's or physical therapist's instructions. Perform all exercises about fifteen times, three times per day or as directed. Exercise both hips, even if you have had only one joint replacement. These exercises can be done on a training (exercise) mat, on the floor, on a table or on a bed. Use whatever works the best and is most comfortable for you. Use music or television while you are exercising so that the exercises  are a pleasant break in your day. This will make your life better with the exercises acting as a break in routine you can look forward to.  °Lying on your back, slowly slide your foot toward your buttocks, raising your knee up off the floor. Then slowly slide your foot back down until your leg is straight again.  °Lying on your back spread your legs as far apart as you can without causing discomfort.  °Lying on your side, raise your upper leg and foot straight up from the floor as far as is comfortable. Slowly lower the leg and repeat.  °Lying on your back, tighten up the muscle in the front of your thigh (quadriceps muscles). You can do this by keeping your leg straight and trying to raise your heel off the floor. This helps strengthen the largest muscle supporting your knee.  °Lying on your back, tighten up the muscles of your buttocks both with the legs straight and with the knee bent at a comfortable angle while keeping your heel on the floor.  ° °SKILLED REHAB INSTRUCTIONS: °If the patient is transferred to a skilled rehab facility following release from the hospital, a list of the current medications will be sent to the facility for the patient to continue.  When discharged from the skilled rehab facility, please have the facility set up the patient's Home Health Physical Therapy prior to being released. Also, the skilled facility will be responsible for providing the patient with their medications at time of release from the facility to include their pain medication and their blood thinner medication. If the patient is still at the rehab facility at time of the two week follow up appointment, the skilled rehab facility will also need to assist the patient in arranging follow up appointment in our office and any transportation needs. ° °MAKE SURE YOU:  °Understand these instructions.  °Will watch your condition.  °Will get help right away if you are not doing well or get worse. ° °Pick up stool softner and  laxative for home use following surgery while on pain medications. °Do not remove your dressing. °The dressing is waterproof--it is OK to take showers. °Continue to use ice for pain and swelling after surgery. °Do not use any lotions or creams on the incision until instructed by your surgeon. °Total Hip Protocol. ° ° °

## 2017-10-21 NOTE — Evaluation (Signed)
Physical Therapy Evaluation Patient Details Name: Michael Mcintosh MRN: [id number] DOB: 01/07/1971 Today's Date: 10/21/2017   History of Present Illness  Pt is a 47 y/o male s/p elective L THA. PMH includes heart murmur.   Clinical Impression  Pt is s/p surgery above with deficits below. Pt with decreased sensation from spinal block, therefore mobility limited to standing and side stepping at EOB. Required min A for mobility using RW. Reviewed supine HEP with pt. Will continue to follow acutely to maximize functional mobility independence and safety.     Follow Up Recommendations Follow surgeon's recommendation for DC plan and follow-up therapies;Supervision for mobility/OOB    Equipment Recommendations  3in1 (PT)    Recommendations for Other Services       Precautions / Restrictions Precautions Precautions: None Precaution Comments: Reviewed supine HEP with pt.  Restrictions Weight Bearing Restrictions: Yes LLE Weight Bearing: Weight bearing as tolerated      Mobility  Bed Mobility Overal bed mobility: Needs Assistance Bed Mobility: Supine to Sit     Supine to sit: Min assist     General bed mobility comments: Min A for LLE assist to come to sitting. Increased time required.   Transfers Overall transfer level: Needs assistance Equipment used: Rolling walker (2 wheeled) Transfers: Sit to/from Stand Sit to Stand: Min assist         General transfer comment: Min A for lift assist and steadying. Verbal cues for safe hand placement.   Ambulation/Gait Ambulation/Gait assistance: Min assist Gait Distance (Feet): 1 Feet Assistive device: Rolling walker (2 wheeled)   Gait velocity: Decreased    General Gait Details: Practiced side stepping along EOB. Distance limited secondary to decreased sensation. Required cues for sequencing with use of RW.   Stairs            Wheelchair Mobility    Modified Rankin (Stroke Patients Only)       Balance Overall  balance assessment: Needs assistance Sitting-balance support: No upper extremity supported;Feet supported Sitting balance-Leahy Scale: Good     Standing balance support: Bilateral upper extremity supported;During functional activity Standing balance-Leahy Scale: Poor Standing balance comment: Reliant on BUE support.                              Pertinent Vitals/Pain Pain Assessment: 0-10 Pain Score: 7  Pain Location: L hip  Pain Descriptors / Indicators: Operative site guarding;Sore Pain Intervention(s): Limited activity within patient's tolerance;Monitored during session;Repositioned    Home Living Family/patient expects to be discharged to:: Private residence Living Arrangements: Spouse/significant other Available Help at Discharge: Family;Available 24 hours/day Type of Home: House Home Access: Stairs to enter Entrance Stairs-Rails: None Entrance Stairs-Number of Steps: 4 Home Layout: One level Home Equipment: Environmental consultantWalker - 2 wheels      Prior Function Level of Independence: Independent               Hand Dominance   Dominant Hand: Right    Extremity/Trunk Assessment   Upper Extremity Assessment Upper Extremity Assessment: Defer to OT evaluation    Lower Extremity Assessment Lower Extremity Assessment: LLE deficits/detail LLE Deficits / Details: Reports decreased sensation in LLE     Cervical / Trunk Assessment Cervical / Trunk Assessment: Normal  Communication   Communication: No difficulties  Cognition Arousal/Alertness: Awake/alert Behavior During Therapy: WFL for tasks assessed/performed Overall Cognitive Status: Within Functional Limits for tasks assessed  General Comments General comments (skin integrity, edema, etc.): Pt's girlfriend present during session.     Exercises Total Joint Exercises Ankle Circles/Pumps: AROM;Both;20 reps Quad Sets: AROM;Left;10 reps Heel Slides:  AAROM;Left;10 reps   Assessment/Plan    PT Assessment Patient needs continued PT services  PT Problem List Decreased strength;Decreased balance;Decreased mobility;Impaired sensation;Decreased knowledge of precautions;Decreased knowledge of use of DME;Pain       PT Treatment Interventions DME instruction;Gait training;Stair training;Functional mobility training;Therapeutic activities;Therapeutic exercise;Cognitive remediation    PT Goals (Current goals can be found in the Care Plan section)  Acute Rehab PT Goals Patient Stated Goal: "to go home tomorrow"  PT Goal Formulation: With patient Time For Goal Achievement: 11/04/17 Potential to Achieve Goals: Good    Frequency 7X/week   Barriers to discharge        Co-evaluation               AM-PAC PT "6 Clicks" Daily Activity  Outcome Measure Difficulty turning over in bed (including adjusting bedclothes, sheets and blankets)?: A Lot Difficulty moving from lying on back to sitting on the side of the bed? : Unable Difficulty sitting down on and standing up from a chair with arms (e.g., wheelchair, bedside commode, etc,.)?: Unable Help needed moving to and from a bed to chair (including a wheelchair)?: A Little Help needed walking in hospital room?: A Lot Help needed climbing 3-5 steps with a railing? : A Lot 6 Click Score: 11    End of Session Equipment Utilized During Treatment: Gait belt Activity Tolerance: Patient tolerated treatment well Patient left: in bed;with call bell/phone within reach;with family/visitor present;Other (comment)(sitting EOB; RN aware ) Nurse Communication: Mobility status PT Visit Diagnosis: Other abnormalities of gait and mobility (R26.89);Pain Pain - Right/Left: Left Pain - part of body: Hip    Time: 9604-5409 PT Time Calculation (min) (ACUTE ONLY): 27 min   Charges:   PT Evaluation $PT Eval Low Complexity: 1 Low PT Treatments $Therapeutic Activity: 8-22 mins   PT G Codes:         Gladys Damme, PT, DPT  Acute Rehabilitation Services  Pager: [phone]   Lehman Prom 10/21/2017, 2:57 PM

## 2017-10-22 ENCOUNTER — Encounter (HOSPITAL_COMMUNITY): Payer: Self-pay | Admitting: Orthopedic Surgery

## 2017-10-22 LAB — CBC
HEMATOCRIT: 34.4 % — AB (ref 39.0–52.0)
HEMOGLOBIN: 11.5 g/dL — AB (ref 13.0–17.0)
MCH: 30.2 pg (ref 26.0–34.0)
MCHC: 33.4 g/dL (ref 30.0–36.0)
MCV: 90.3 fL (ref 78.0–100.0)
Platelets: 186 10*3/uL (ref 150–400)
RBC: 3.81 MIL/uL — ABNORMAL LOW (ref 4.22–5.81)
RDW: 12 % (ref 11.5–15.5)
WBC: 7.9 10*3/uL (ref 4.0–10.5)

## 2017-10-22 LAB — BASIC METABOLIC PANEL
ANION GAP: 4 — AB (ref 5–15)
BUN: 11 mg/dL (ref 6–20)
CALCIUM: 8 mg/dL — AB (ref 8.9–10.3)
CHLORIDE: 108 mmol/L (ref 98–111)
CO2: 28 mmol/L (ref 22–32)
CREATININE: 0.95 mg/dL (ref 0.61–1.24)
GFR calc Af Amer: >60 mL/min (ref >60)
GFR calc non Af Amer: >60 mL/min (ref >60)
GLUCOSE: 109 mg/dL — AB (ref 70–99)
Potassium: 4 mmol/L (ref 3.5–5.1)
Sodium: 140 mmol/L (ref 135–145)

## 2017-10-22 MED ORDER — SENNA 8.6 MG PO TABS: 1.0000 | ORAL_TABLET | Freq: Two times a day (BID) | ORAL | 0 refills | Status: AC

## 2017-10-22 MED ORDER — HYDROCODONE-ACETAMINOPHEN 5-325 MG PO TABS: 1.0000 | ORAL_TABLET | Freq: Four times a day (QID) | ORAL | 0 refills | Status: AC | PRN

## 2017-10-22 MED ORDER — ONDANSETRON HCL 4 MG PO TABS: 4.0000 mg | ORAL_TABLET | Freq: Four times a day (QID) | ORAL | 0 refills | Status: AC | PRN

## 2017-10-22 MED ORDER — DOCUSATE SODIUM 100 MG PO CAPS: 100.0000 mg | ORAL_CAPSULE | Freq: Two times a day (BID) | ORAL | 1 refills | Status: AC

## 2017-10-22 MED ORDER — ASPIRIN 81 MG PO CHEW: 81.0000 mg | CHEWABLE_TABLET | Freq: Two times a day (BID) | ORAL | 1 refills | Status: AC

## 2017-10-22 NOTE — Progress Notes (Signed)
    Subjective:  Patient reports pain as mild to moderate.  Denies N/V/CP/SOB. No c/o.  Objective:   VITALS:   Vitals:   10/21/17 1144 10/21/17 2003 10/22/17 0005 10/22/17 0416  BP: 104/73 129/67 135/75 131/77  Pulse: (!) 47 62 71 (!) 58  Resp:  16 16 18   Temp: 97.7 F (36.5 C) 99.1 F (37.3 C) 98.8 F (37.1 C) 98.4 F (36.9 C)  TempSrc: Oral Oral Oral Oral  SpO2: 100% 97% 97% 96%  Weight:      Height:        NAD ABD soft Sensation intact distally Intact pulses distally Dorsiflexion/Plantar flexion intact Incision: dressing C/D/I Compartment soft   Lab Results  Component Value Date   WBC 7.9 10/22/2017   HGB 11.5 (L) 10/22/2017   HCT 34.4 (L) 10/22/2017   MCV 90.3 10/22/2017   PLT 186 10/22/2017   BMET    Component Value Date/Time   NA 140 10/22/2017 0540   K 4.0 10/22/2017 0540   CL 108 10/22/2017 0540   CO2 28 10/22/2017 0540   GLUCOSE 109 (H) 10/22/2017 0540   BUN 11 10/22/2017 0540   CREATININE 0.95 10/22/2017 0540   CALCIUM 8.0 (L) 10/22/2017 0540   GFRNONAA >60 10/22/2017 0540   GFRAA >60 10/22/2017 0540     Assessment/Plan: 1 Day Post-Op   Principal Problem:   Osteoarthritis of left hip   WBAT with walker DVT ppx: Aspirin, SCDs, TEDS PO pain control PT/OT Dispo: D/C home with HEP   Iline OvenBrian J Aries Kasa 10/22/2017, 7:52 AM   Samson FredericBrian Pearce [place], MD Cell [phone]

## 2017-10-22 NOTE — Discharge Summary (Signed)
Physician Discharge Summary  Patient ID: Michael Mcintosh MRN: [id number] DOB/AGE: [birth year] 48 y.o.  Admit date: 10/21/2017 Discharge date: 10/22/2017  Admission Diagnoses:  Osteoarthritis of left hip  Discharge Diagnoses:  Principal Problem:   Osteoarthritis of left hip   Past Medical History:  Diagnosis Date  . Arthritis   . Complication of anesthesia    never had anesthesia  . Dysrhythmia    slow HR arrythmia about 5 years ago, resolved  . Family history of adverse reaction to anesthesia    father stopped breathing with hip surgery  . Heart murmur    "when I was younger"    Surgeries: Procedure(s): LEFT TOTAL HIP ARTHROPLASTY ANTERIOR APPROACH on 10/21/2017   Consultants (if any):   Discharged Condition: Improved  Hospital Course: Michael Mcintosh is an 47 y.o. male who was admitted 10/21/2017 with a diagnosis of Osteoarthritis of left hip and went to the operating room on 10/21/2017 and underwent the above named procedures.    He was given perioperative antibiotics:  Anti-infectives (From admission, onward)   Start     Dose/Rate Route Frequency Ordered Stop   10/21/17 1345  ceFAZolin (ANCEF) IVPB 2g/100 mL premix     2 g 200 mL/hr over 30 Minutes Intravenous Every 6 hours 10/21/17 1140 10/21/17 2204   10/21/17 0700  ceFAZolin (ANCEF) IVPB 2g/100 mL premix     2 g 200 mL/hr over 30 Minutes Intravenous On call to O.R. 10/21/17 8295 10/21/17 0746    .  He was given sequential compression devices, early ambulation, and ASA for DVT prophylaxis.  He benefited maximally from the hospital stay and there were no complications.    Recent vital signs:  Vitals:   10/22/17 0005 10/22/17 0416  BP: 135/75 131/77  Pulse: 71 (!) 58  Resp: 16 18  Temp: 98.8 F (37.1 C) 98.4 F (36.9 C)  SpO2: 97% 96%    Recent laboratory studies:  Lab Results  Component Value Date   HGB 11.5 (L) 10/22/2017   HGB 14.9 10/07/2017   Lab Results  Component Value Date   WBC 7.9  10/22/2017   PLT 186 10/22/2017   No results found for: INR Lab Results  Component Value Date   NA 140 10/22/2017   K 4.0 10/22/2017   CL 108 10/22/2017   CO2 28 10/22/2017   BUN 11 10/22/2017   CREATININE 0.95 10/22/2017   GLUCOSE 109 (H) 10/22/2017    Discharge Medications:   Allergies as of 10/22/2017   No Known Allergies     Medication List    TAKE these medications   aspirin 81 MG chewable tablet Chew 1 tablet (81 mg total) by mouth 2 (two) times daily.   docusate sodium 100 MG capsule Commonly known as:  COLACE Take 1 capsule (100 mg total) by mouth 2 (two) times daily.   HYDROcodone-acetaminophen 5-325 MG tablet Commonly known as:  NORCO/VICODIN Take 1-2 tablets by mouth every 6 (six) hours as needed for moderate pain (pain score 4-6).   lisinopril 20 MG tablet Commonly known as:  PRINIVIL,ZESTRIL Take 20 mg by mouth daily.   ondansetron 4 MG tablet Commonly known as:  ZOFRAN Take 1 tablet (4 mg total) by mouth every 6 (six) hours as needed for nausea.   senna 8.6 MG Tabs tablet Commonly known as:  SENOKOT Take 1 tablet (8.6 mg total) by mouth 2 (two) times daily.       Diagnostic Studies: Dg Pelvis Portable  Result Date: 10/21/2017 CLINICAL DATA:  47 year old male status post left hip arthroplasty. EXAM: PORTABLE PELVIS 1-2 VIEWS COMPARISON:  Intraoperative images 0919 hours today. FINDINGS: Two AP portable views of the left hip and pelvis at 1022 hours. 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 The hardware appears intact and normally aligned in the AP view. No unexpected osseous changes identified. Superimposed bulky bilateral acetabular spurring. Negative visible abdominal and pelvic visceral contours. Postoperative changes to the soft tissue surrounding left hip. 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 Electronically Signed   By: Odessa Fleming M.D.   On: 10/21/2017 10:33   Dg C-arm 1-60 Min  Result Date: 10/21/2017 CLINICAL  DATA:  Status post left total hip joint prosthesis placement using anterior approach. EXAM: OPERATIVE left HIP (WITH PELVIS IF PERFORMED) to VIEWS TECHNIQUE: Fluoroscopic spot image(s) were submitted for interpretation post-operatively. COMPARISON:  None. FINDINGS: Reported fluoro time is 24 seconds. The patient has undergone left hip joint prosthesis placement. Radiographic positioning of the prosthetic components is good. The interface with the native bone is normal. IMPRESSION: No immediate postprocedure complication following anterior approach left hip joint prosthesis placement. Electronically Signed   By: David  [place] M.D.   On: 10/21/2017 09:42   Dg C-arm 1-60 Min  Result Date: 10/21/2017 CLINICAL DATA:  Status post left total hip joint prosthesis placement using anterior approach. EXAM: OPERATIVE left HIP (WITH PELVIS IF PERFORMED) to VIEWS TECHNIQUE: Fluoroscopic spot image(s) were submitted for interpretation post-operatively. COMPARISON:  None. FINDINGS: Reported fluoro time is 24 seconds. The patient has undergone left hip joint prosthesis placement. Radiographic positioning of the prosthetic components is good. The interface with the native bone is normal. IMPRESSION: No immediate postprocedure complication following anterior approach left hip joint prosthesis placement. Electronically Signed   By: David  [place] M.D.   On: 10/21/2017 09:42   Dg Hip Operative Unilat W Or W/o Pelvis Left  Result Date: 10/21/2017 CLINICAL DATA:  Status post left total hip joint prosthesis placement using anterior approach. EXAM: OPERATIVE left HIP (WITH PELVIS IF PERFORMED) to VIEWS TECHNIQUE: Fluoroscopic spot image(s) were submitted for interpretation post-operatively. COMPARISON:  None. FINDINGS: Reported fluoro time is 24 seconds. The patient has undergone left hip joint prosthesis placement. Radiographic positioning of the prosthetic components is good. The interface with the native bone is normal.  IMPRESSION: No immediate postprocedure complication following anterior approach left hip joint prosthesis placement. Electronically Signed   By: David  [place] M.D.   On: 10/21/2017 09:42    Disposition:    Discharge Instructions    Call MD / Call 911   Complete by:  As directed    If you experience chest pain or shortness of breath, CALL 911 and be transported to the hospital emergency room.  If you develope a fever above 101 F, pus (white drainage) or increased drainage or redness at the wound, or calf pain, call your surgeon's office.   Constipation Prevention   Complete by:  As directed    Drink plenty of fluids.  Prune juice may be helpful.  You may use a stool softener, such as Colace (over the counter) 100 mg twice a day.  Use MiraLax (over the counter) for constipation as needed.   Diet - low sodium heart healthy   Complete by:  As directed    Driving restrictions   Complete by:  As directed    No driving for 2 weeks   Increase activity slowly as tolerated   Complete by:  As directed    Lifting  restrictions   Complete by:  As directed    No lifting for 6 weeks   TED hose   Complete by:  As directed    Use stockings (TED hose) for 2 weeks on both leg(s).  You may remove them at night for sleeping.      Follow-up Information    Khori Rosevear, Arlys JohnBrian, MD. Schedule an appointment as soon as possible for a visit in 2 weeks.   Specialty:  Orthopedic Surgery Why:  For wound re-check Contact information: [address] [place] [address] [phone]            Signed: Iline OvenBrian J Shaylon Aden 10/24/2017, 4:45 PM

## 2017-10-22 NOTE — Progress Notes (Signed)
Pt was ambulating with walker to the hall with PT.  Denies pain, just soreness to left hip. Discharge instructions given to pt, verbalized understanding.  Discharged to home accompanied by girlfriend.

## 2017-10-22 NOTE — Progress Notes (Signed)
Physical Therapy Treatment Patient Details Name: Michael Mcintosh MRN: [id number] DOB: [birth year] Today's Date: 10/22/2017    History of Present Illness Pt is a 47 y/o male s/p elective L THA. PMH includes heart murmur.     PT Comments    Pt performed progression of gait training to step through pattern.  Pt educated on stair negotiation and performed to ensure safe entry into home.  Once back in room reviewed and complete HEP in its entirety to ensure correct technique and frequency was understood.  Girlfriend present at bedside and will be support for patient at home.  Answered all questions and informed nursing that patient was ready to d/c home.    Follow Up Recommendations  Follow surgeon's recommendation for DC plan and follow-up therapies;Supervision for mobility/OOB     Equipment Recommendations  3in1 (PT)    Recommendations for Other Services       Precautions / Restrictions Precautions Precautions: None Precaution Comments: Reviewed supine/seated and standing HEP with pt.  Restrictions Weight Bearing Restrictions: Yes LLE Weight Bearing: Weight bearing as tolerated    Mobility  Bed Mobility Overal bed mobility: Modified Independent Bed Mobility: Supine to Sit     Supine to sit: Modified independent (Device/Increase time)     General bed mobility comments: Increased time and effort but no assistance.  Transfers Overall transfer level: Needs assistance Equipment used: Rolling walker (2 wheeled) Transfers: Sit to/from Stand Sit to Stand: Supervision         General transfer comment: Cues for hand placement to and from seated surface.  Pt holding to RW for stand to sit.    Ambulation/Gait Ambulation/Gait assistance: Min assist Gait Distance (Feet): 200 Feet Assistive device: Rolling walker (2 wheeled) Gait Pattern/deviations: Step-through pattern;Shuffle;Decreased stride length;Trunk flexed;Antalgic;Step-to pattern;Decreased dorsiflexion - right Gait  velocity: Decreased    General Gait Details: Cues for progression to step through pattern, cues for heel strike on R and gait symmetry.     Stairs Stairs: Yes Stairs assistance: Min assist Stair Management: No rails;Backwards;Step to pattern Number of Stairs: 6 General stair comments: Cues for sequencing and RW placement patient performed x6 stairs with good recall of RW placement and sequencing.     Wheelchair Mobility    Modified Rankin (Stroke Patients Only)       Balance Overall balance assessment: Needs assistance Sitting-balance support: No upper extremity supported;Feet supported Sitting balance-Leahy Scale: Good       Standing balance-Leahy Scale: Poor                              Cognition Arousal/Alertness: Awake/alert Behavior During Therapy: WFL for tasks assessed/performed Overall Cognitive Status: Within Functional Limits for tasks assessed                                        Exercises Total Joint Exercises Ankle Circles/Pumps: AROM;Both;20 reps Quad Sets: AROM;Left;10 reps Short Arc Quad: AROM;Left;10 reps;Supine Heel Slides: AROM;Left;10 reps;Supine Hip ABduction/ADduction: AROM;Left;10 reps;Supine Knee Flexion: AROM;Left;10 reps;Standing Marching in Standing: AROM;Left;10 reps;Standing Standing Hip Extension: AROM;Left;10 reps;Standing General Exercises - Lower Extremity Hip ABduction/ADduction: AROM;Left;10 reps;Standing    General Comments        Pertinent Vitals/Pain Pain Assessment: 0-10 Pain Score: 7  Pain Location: L hip  Pain Descriptors / Indicators: Operative site guarding;Sore Pain Intervention(s): Monitored during session;Repositioned;Ice applied    Home  Living                      Prior Function            PT Goals (current goals can now be found in the care plan section) Acute Rehab PT Goals Patient Stated Goal: "to go home tomorrow"  Potential to Achieve Goals: Good Progress  towards PT goals: Progressing toward goals    Frequency    7X/week      PT Plan Current plan remains appropriate    Co-evaluation              AM-PAC PT "6 Clicks" Daily Activity  Outcome Measure  Difficulty turning over in bed (including adjusting bedclothes, sheets and blankets)?: A Lot Difficulty moving from lying on back to sitting on the side of the bed? : Unable Difficulty sitting down on and standing up from a chair with arms (e.g., wheelchair, bedside commode, etc,.)?: Unable Help needed moving to and from a bed to chair (including a wheelchair)?: A Little Help needed walking in hospital room?: A Lot Help needed climbing 3-5 steps with a railing? : A Lot 6 Click Score: 11    End of Session Equipment Utilized During Treatment: Gait belt Activity Tolerance: Patient tolerated treatment well Patient left: in bed;with call bell/phone within reach;with family/visitor present Nurse Communication: Mobility status(patient ready for d/c) PT Visit Diagnosis: Other abnormalities of gait and mobility (R26.89);Pain Pain - Right/Left: Left Pain - part of body: Hip     Time: 9562-1308 PT Time Calculation (min) (ACUTE ONLY): 33 min  Charges:  $Gait Training: 8-22 mins $Therapeutic Exercise: 8-22 mins                    G Codes:       Joycelyn Rua, PTA pager [phone]    Michael Mcintosh 10/22/2017, 11:04 AM

## 2019-04-29 ENCOUNTER — Ambulatory Visit: Payer: BC Managed Care – PPO | Attending: Internal Medicine

## 2019-04-29 DIAGNOSIS — Z20822 Contact with and (suspected) exposure to covid-19: Secondary | ICD-10-CM

## 2019-05-01 LAB — NOVEL CORONAVIRUS, NAA: SARS-CoV-2, NAA: NOT DETECTED

## 2019-10-21 IMAGING — DX DG PORTABLE PELVIS
2 series · 2 of 2 positions shown · non-contrast
Comparison: Intraoperative images 0383 hours today.

CLINICAL DATA: 46-year-old male status post left hip arthroplasty.

EXAM:
PORTABLE PELVIS 1-2 VIEWS

[pelvis ap (1 of 2)]
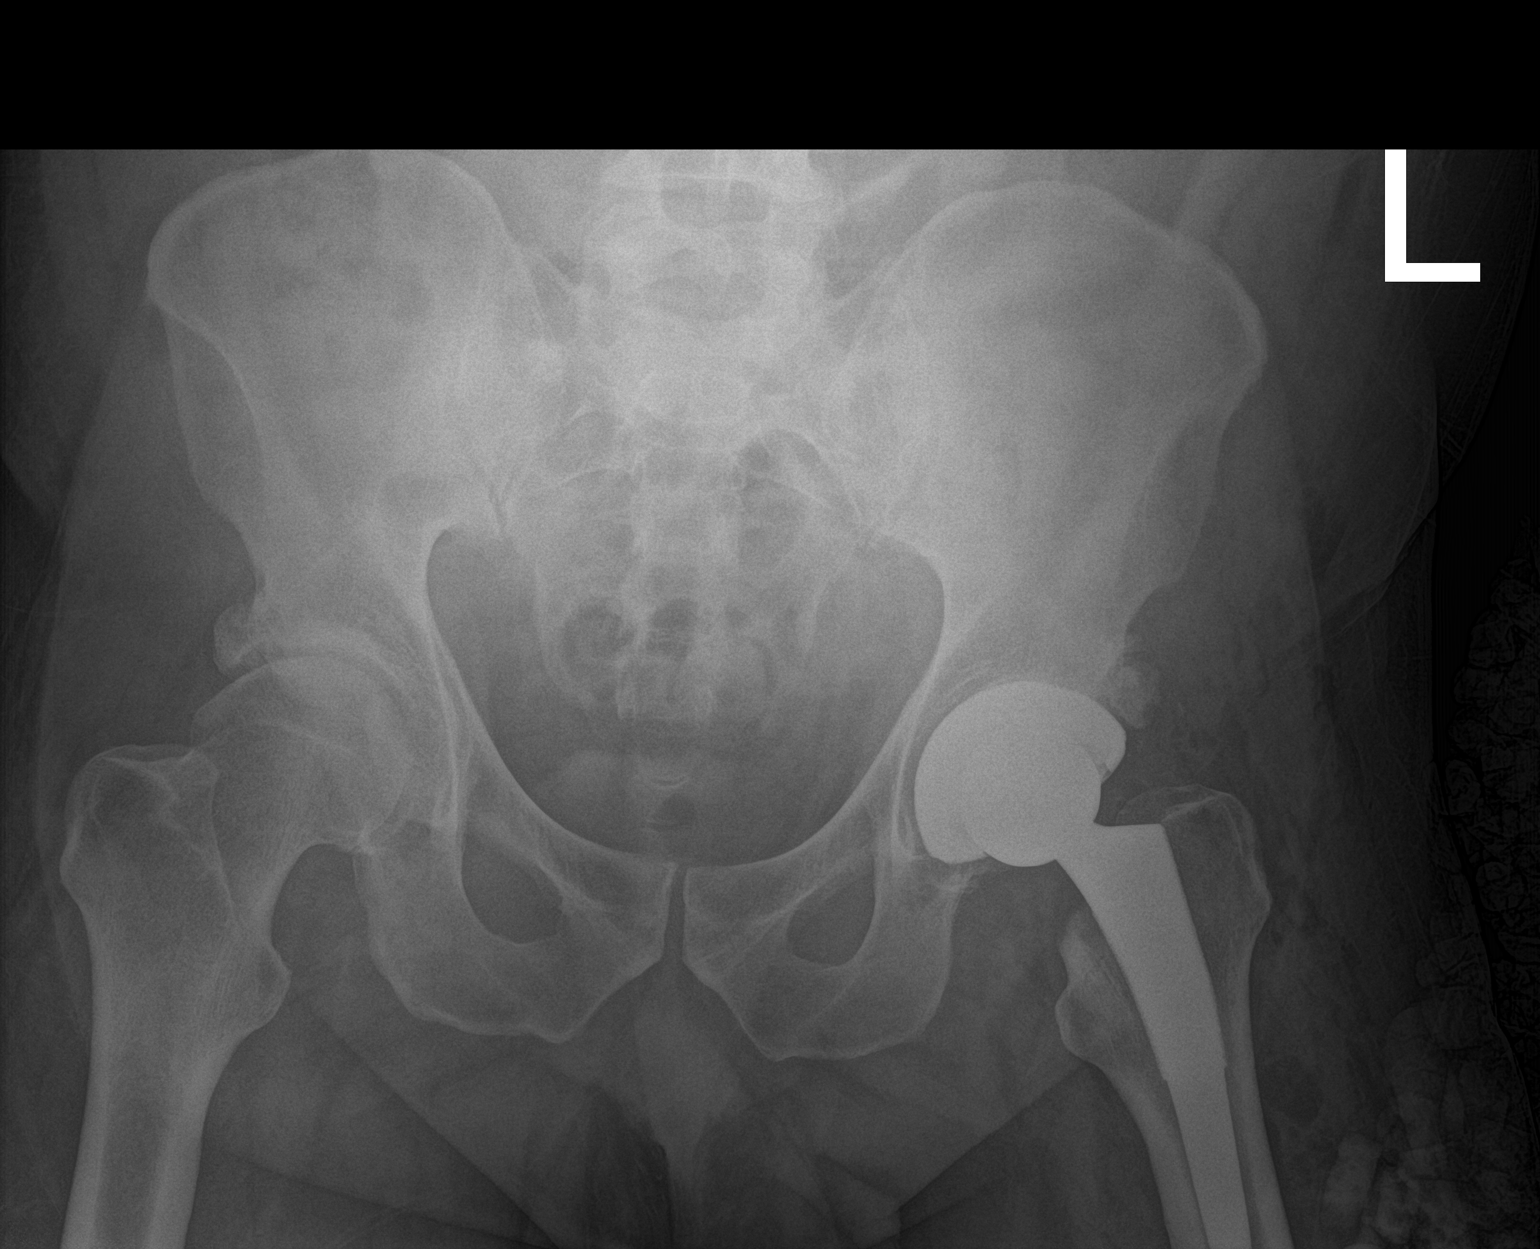

[pelvis ap (2 of 2)]
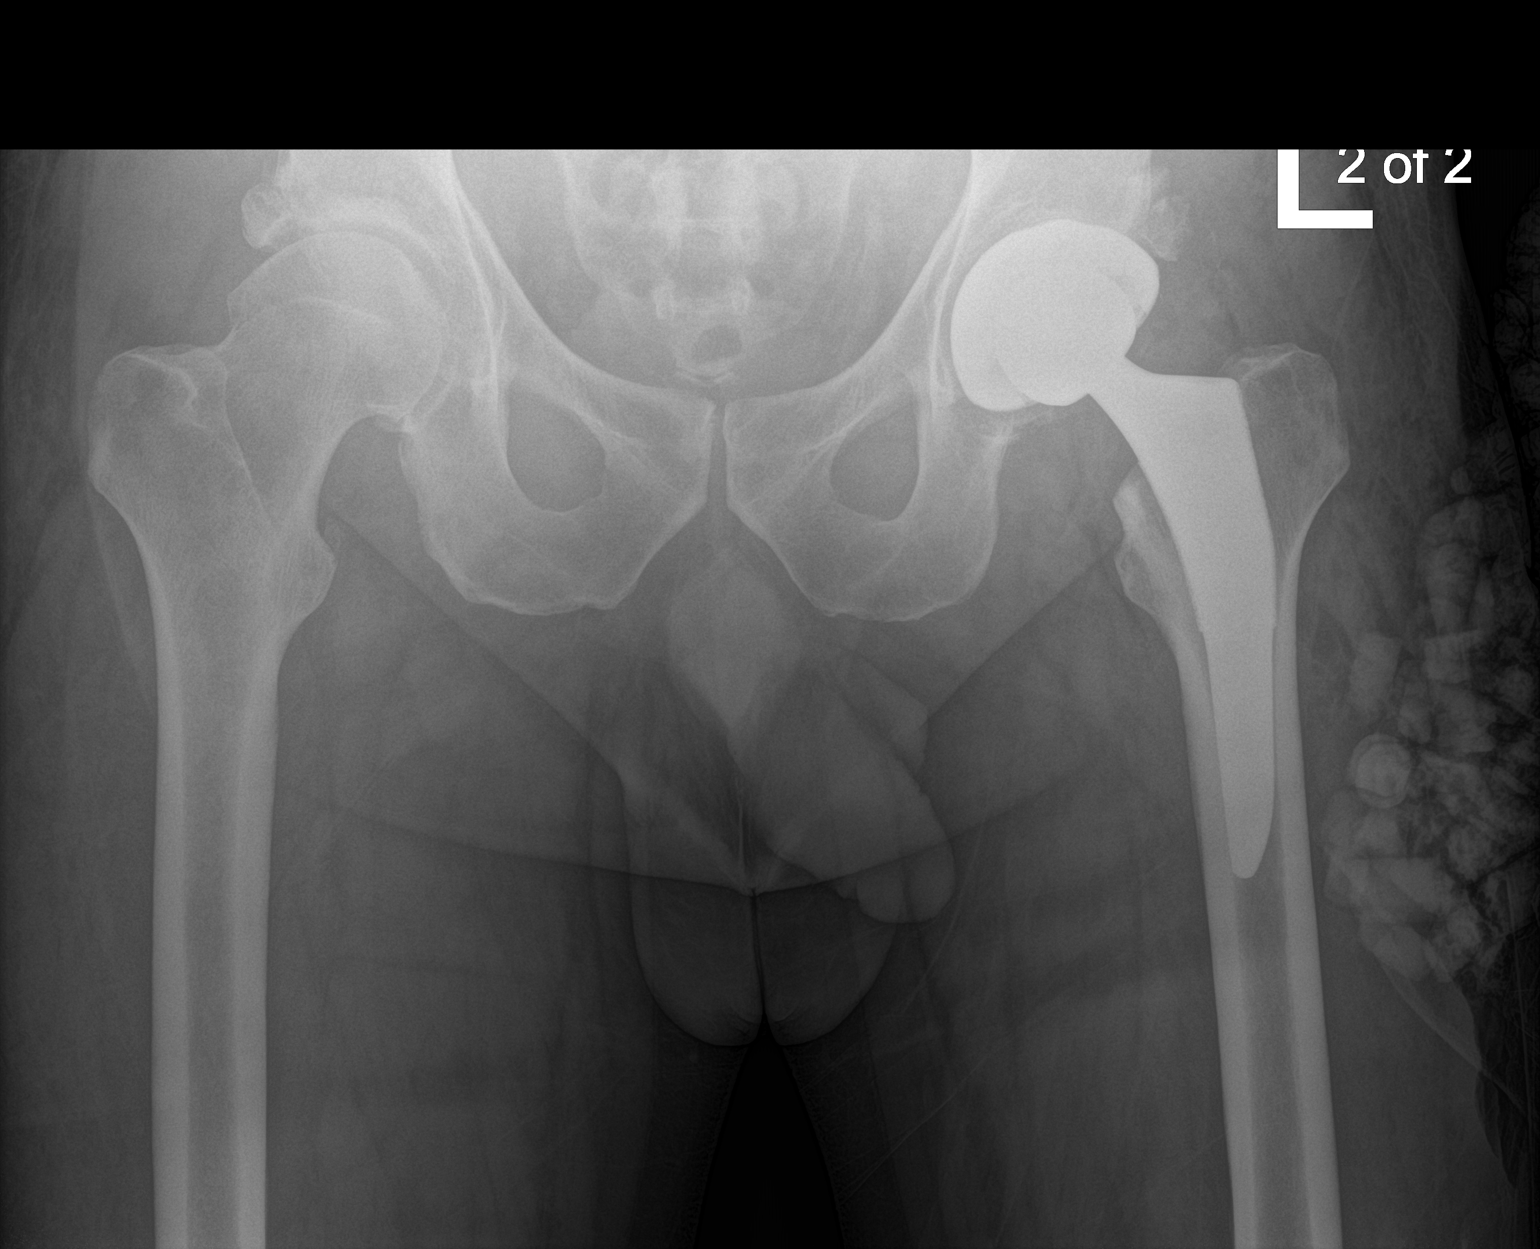

[2 of 2 positions shown; findings below may reference images not displayed]

FINDINGS: Two AP portable views of the left hip and pelvis at 3255 hours.
Bipolar type left hip arthroplasty in place. The hardware appears
intact and normally aligned in the AP view. No unexpected osseous
changes identified. Superimposed bulky bilateral acetabular
spurring. Negative visible abdominal and pelvic visceral contours.
Postoperative changes to the soft tissue surrounding left hip.
IMPRESSION: Bipolar left hip arthroplasty with no adverse features.
